# Patient Record
Sex: Male | Born: 2000 | Hispanic: No | Marital: Single
Health system: Southern US, Community
[De-identification: ages and names within clinical notes are randomized; demographics above are authoritative.]

---

## 2022-04-19 ENCOUNTER — Other Ambulatory Visit: Payer: Self-pay

## 2022-04-19 ENCOUNTER — Emergency Department (HOSPITAL_COMMUNITY)
Admission: EM | Admit: 2022-04-19 | Discharge: 2022-04-19 | Disposition: A | Discharge status: Home / Self Care | Payer: 59 | Attending: Emergency Medicine | Admitting: Emergency Medicine

## 2022-04-19 ENCOUNTER — Encounter (HOSPITAL_COMMUNITY): Payer: Self-pay | Admitting: Emergency Medicine

## 2022-04-19 ENCOUNTER — Emergency Department (HOSPITAL_COMMUNITY): Payer: 59

## 2022-04-19 DIAGNOSIS — D72829 Elevated white blood cell count, unspecified: Secondary | ICD-10-CM | POA: Diagnosis not present

## 2022-04-19 DIAGNOSIS — R197 Diarrhea, unspecified: Secondary | ICD-10-CM | POA: Insufficient documentation

## 2022-04-19 DIAGNOSIS — R1084 Generalized abdominal pain: Secondary | ICD-10-CM | POA: Insufficient documentation

## 2022-04-19 DIAGNOSIS — R112 Nausea with vomiting, unspecified: Secondary | ICD-10-CM | POA: Diagnosis present

## 2022-04-19 LAB — CBC
HCT: 50.2 % (ref 39.0–52.0)
Hemoglobin: 18.6 g/dL — ABNORMAL HIGH (ref 13.0–17.0)
MCH: 31.1 pg (ref 26.0–34.0)
MCHC: 37.1 g/dL — ABNORMAL HIGH (ref 30.0–36.0)
MCV: 83.8 fL (ref 80.0–100.0)
Platelets: 278 10*3/uL (ref 150–400)
RBC: 5.99 MIL/uL — ABNORMAL HIGH (ref 4.22–5.81)
RDW: 12.3 % (ref 11.5–15.5)
WBC: 18.7 10*3/uL — ABNORMAL HIGH (ref 4.0–10.5)
nRBC: 0 % (ref 0.0–0.2)

## 2022-04-19 LAB — COMPREHENSIVE METABOLIC PANEL
ALT: 13 U/L (ref 0–44)
AST: 20 U/L (ref 15–41)
Albumin: 4.7 g/dL (ref 3.5–5.0)
Alkaline Phosphatase: 113 U/L (ref 38–126)
Anion gap: 14 (ref 5–15)
BUN: 15 mg/dL (ref 6–20)
CO2: 21 mmol/L — ABNORMAL LOW (ref 22–32)
Calcium: 9.6 mg/dL (ref 8.9–10.3)
Chloride: 103 mmol/L (ref 98–111)
Creatinine, Ser: 1 mg/dL (ref 0.61–1.24)
GFR, Estimated: >60 mL/min (ref >60)
Glucose, Bld: 109 mg/dL — ABNORMAL HIGH (ref 70–99)
Potassium: 4.1 mmol/L (ref 3.5–5.1)
Sodium: 138 mmol/L (ref 135–145)
Total Bilirubin: 1.6 mg/dL — ABNORMAL HIGH (ref 0.3–1.2)
Total Protein: 7.3 g/dL (ref 6.5–8.1)

## 2022-04-19 LAB — CBC WITH DIFFERENTIAL/PLATELET
Abs Immature Granulocytes: 0.06 10*3/uL (ref 0.00–0.07)
Basophils Absolute: 0.1 10*3/uL (ref 0.0–0.1)
Basophils Relative: 0 %
Eosinophils Absolute: 0.1 10*3/uL (ref 0.0–0.5)
Eosinophils Relative: 0 %
HCT: 49.8 % (ref 39.0–52.0)
Hemoglobin: 17.7 g/dL — ABNORMAL HIGH (ref 13.0–17.0)
Immature Granulocytes: 0 %
Lymphocytes Relative: 7 %
Lymphs Abs: 1.1 10*3/uL (ref 0.7–4.0)
MCH: 30.4 pg (ref 26.0–34.0)
MCHC: 35.5 g/dL (ref 30.0–36.0)
MCV: 85.6 fL (ref 80.0–100.0)
Monocytes Absolute: 1.1 10*3/uL — ABNORMAL HIGH (ref 0.1–1.0)
Monocytes Relative: 7 %
Neutro Abs: 13.5 10*3/uL — ABNORMAL HIGH (ref 1.7–7.7)
Neutrophils Relative %: 86 %
Platelets: 246 10*3/uL (ref 150–400)
RBC: 5.82 MIL/uL — ABNORMAL HIGH (ref 4.22–5.81)
RDW: 12.2 % (ref 11.5–15.5)
WBC: 15.9 10*3/uL — ABNORMAL HIGH (ref 4.0–10.5)
nRBC: 0 % (ref 0.0–0.2)

## 2022-04-19 LAB — URINALYSIS, ROUTINE W REFLEX MICROSCOPIC
Bacteria, UA: NONE SEEN
Bilirubin Urine: NEGATIVE
Glucose, UA: NEGATIVE mg/dL
Hgb urine dipstick: NEGATIVE
Ketones, ur: 80 mg/dL — AB
Leukocytes,Ua: NEGATIVE
Nitrite: NEGATIVE
Protein, ur: 30 mg/dL — AB
Specific Gravity, Urine: 1.032 — ABNORMAL HIGH (ref 1.005–1.030)
pH: 5 (ref 5.0–8.0)

## 2022-04-19 LAB — LIPASE, BLOOD: Lipase: 34 U/L (ref 11–51)

## 2022-04-19 MED ORDER — SODIUM CHLORIDE 0.9 % IV BOLUS
2000.0000 mL | Freq: Once | INTRAVENOUS | Status: AC
  Administered 2022-04-19: 2000 mL via INTRAVENOUS

## 2022-04-19 MED ORDER — ONDANSETRON HCL 4 MG/2ML IJ SOLN
4.0000 mg | Freq: Once | INTRAMUSCULAR | Status: AC
  Administered 2022-04-19: 4 mg via INTRAVENOUS
  Filled 2022-04-19: qty 2

## 2022-04-19 MED ORDER — IOHEXOL 300 MG/ML  SOLN
100.0000 mL | Freq: Once | INTRAMUSCULAR | Status: AC | PRN
  Administered 2022-04-19: 100 mL via INTRAVENOUS

## 2022-04-19 MED ORDER — ONDANSETRON 4 MG PO TBDP: 4.0000 mg | ORAL_TABLET | Freq: Three times a day (TID) | ORAL | 0 refills | Status: AC | PRN

## 2022-04-19 MED ORDER — DICYCLOMINE HCL 20 MG PO TABS: 20.0000 mg | ORAL_TABLET | Freq: Two times a day (BID) | ORAL | 0 refills | Status: AC

## 2022-04-19 NOTE — Discharge Instructions (Addendum)
Your work-up today was reassuring.  Your CT scan did not show any evidence of acute infection, inflammation concerning for serious cause of your symptoms.  Given your food ingestion followed by your symptoms you most likely have gastroenteritis.  Typically this peaks at about 3 days.  I have sent Zofran into the pharmacy for you.  Please ensure that you are staying hydrated and drinking plenty of fluids.  If you have any worsening or concerning symptoms such as worsening abdominal pain, fever, inability to tolerate any p.o. intake despite taking Zofran please return to the emergency room.  Otherwise I recommend you follow-up with your PCP. ?I have also sent Bentyl into the pharmacy for you which you can take as needed for abdominal pain. ?

## 2022-04-19 NOTE — ED Triage Notes (Signed)
Pt reported to ED with c/o pain to epigastric area of abdomen and vomiting since approximately 8 pm last night. Pt states he has also had diarrhea. Denies any fevers, shortness of breath or cough or chest pain.  ?

## 2022-04-19 NOTE — ED Provider Notes (Signed)
?[facility] ?Provider Note ? ? ?CSN: [id number] ?Arrival date & time: 04/19/22  [id number] ? ?  ? ?History ? ?Chief Complaint  ?Patient presents with  ? Abdominal Pain  ? ? ?Thomas Bolton is a 21 y.o. male. ? ?21 year old male presents with his roommate for evaluation of nausea, vomiting, abdominal pain onset last night around 8 PM following dinner.  Patient states he had tikka masala.  States he was feeling well without any complaints prior to event and immediately after his supper he had all of the presenting symptoms.  States he had about 14 episodes of emesis and 2 episodes of diarrhea since then.  Does report mild improvement in abdominal pain.  Denies fever, chills.  Emesis nonbloody and nonbilious.  No blood in the stool. ? ?The history is provided by the patient. No language interpreter was used.  ? ?  ? ?Home Medications ?Prior to Admission medications   ?Not on File  ?   ? ?Allergies    ?Patient has no known allergies.   ? ?Review of Systems   ?Review of Systems  ?Constitutional:  Negative for chills and fever.  ?Gastrointestinal:  Positive for abdominal pain, diarrhea, nausea and vomiting. Negative for blood in stool and constipation.  ?Neurological:  Negative for syncope and light-headedness.  ?All other systems reviewed and are negative. ? ?Physical Exam ?Updated Vital Signs ?BP 122/90   Pulse 64   Temp 98.4 ?F (36.9 ?C) (Oral)   Resp 15   SpO2 98%  ?Physical Exam ?Vitals and nursing note reviewed.  ?Constitutional:   ?   General: He is not in acute distress. ?   Appearance: Normal appearance. He is not ill-appearing.  ?HENT:  ?   Head: Normocephalic and atraumatic.  ?   Nose: Nose normal.  ?Eyes:  ?   General: No scleral icterus. ?   Extraocular Movements: Extraocular movements intact.  ?   Conjunctiva/sclera: Conjunctivae normal.  ?Cardiovascular:  ?   Rate and Rhythm: Normal rate and regular rhythm.  ?   Pulses: Normal pulses.  ?   Heart sounds: Normal heart sounds.   ?Pulmonary:  ?   Effort: Pulmonary effort is normal. No respiratory distress.  ?   Breath sounds: Normal breath sounds. No wheezing or rales.  ?Abdominal:  ?   General: There is no distension.  ?   Tenderness: There is abdominal tenderness (Mild tenderness diffusely.). There is no right CVA tenderness, left CVA tenderness, guarding or rebound.  ?Musculoskeletal:     ?   General: Normal range of motion.  ?   Cervical back: Normal range of motion.  ?Skin: ?   General: Skin is warm and dry.  ?Neurological:  ?   General: No focal deficit present.  ?   Mental Status: He is alert. Mental status is at baseline.  ? ? ?ED Results / Procedures / Treatments   ?Labs ?(all labs ordered are listed, but only abnormal results are displayed) ?Labs Reviewed  ?COMPREHENSIVE METABOLIC PANEL - Abnormal; Notable for the following components:  ?    Result Value  ? CO2 21 (*)   ? Glucose, Bld 109 (*)   ? Total Bilirubin 1.6 (*)   ? All other components within normal limits  ?CBC - Abnormal; Notable for the following components:  ? WBC 18.7 (*)   ? RBC 5.99 (*)   ? Hemoglobin 18.6 (*)   ? MCHC 37.1 (*)   ? All other components within normal limits  ?  URINALYSIS, ROUTINE W REFLEX MICROSCOPIC - Abnormal; Notable for the following components:  ? Color, Urine AMBER (*)   ? Specific Gravity, Urine 1.032 (*)   ? Ketones, ur 80 (*)   ? Protein, ur 30 (*)   ? All other components within normal limits  ?LIPASE, BLOOD  ?CBC WITH DIFFERENTIAL/PLATELET  ? ? ?EKG ?None ? ?Radiology ?No results found. ? ?Procedures ?Procedures  ? ? ?Medications Ordered in ED ?Medications  ?sodium chloride 0.9 % bolus 2,000 mL (has no administration in time range)  ?ondansetron (ZOFRAN) injection 4 mg (has no administration in time range)  ? ? ?ED Course/ Medical Decision Making/ A&P ?  ?                        ?Medical Decision Making ?Amount and/or Complexity of Data Reviewed ?Labs: ordered. ?Radiology: ordered. ? ?Risk ?Prescription drug management. ? ? ?Medical  Decision Making / ED Course ? ? ?This patient presents to the ED for concern of nausea, vomiting, abdominal pain, this involves an extensive number of treatment options, and is a complaint that carries with it a high risk of complications and morbidity.  The differential diagnosis includes appendicitis, gastroenteritis, cholecystitis, pancreatitis, colitis ? ?MDM: ?21 year old male presents with complaints of nausea, vomiting, diarrhea and mild abdominal pain since 8 PM last night following eating dinner.  Denies alcohol intake.  He had [place] food.  States he was feeling well before dinner.  Denies other complaints.  Appears well on exam.  Initial blood work shows leukocytosis of 18.7.  CMP without acute concerns.  Lipase within normal limits.  UA without UTI.  Will provide Zofran, IV hydration.  Will evaluate with CT abdomen pelvis to rule out appendicitis, cholecystitis, other acute intra-abdominal process.  We will add on CBC with differential. ?Patient following fluids and Zofran reports significant improvement in symptoms.  Has not had any additional episode of emesis.  CT abdomen pelvis does not show evidence of acute intra-abdominal process.  It does show changes consistent with enteritis.  Patient most likely has gastroenteritis.  Discussed with patient and his roommate they both voiced understanding.  Will provide Zofran and Bentyl and discussed symptomatic treatment.  They voiced understanding and are in agreement with plan. ? ? ?Lab Tests: ?-I ordered, reviewed, and interpreted labs.   ?The pertinent results include:   ?Labs Reviewed  ?COMPREHENSIVE METABOLIC PANEL - Abnormal; Notable for the following components:  ?    Result Value  ? CO2 21 (*)   ? Glucose, Bld 109 (*)   ? Total Bilirubin 1.6 (*)   ? All other components within normal limits  ?CBC - Abnormal; Notable for the following components:  ? WBC 18.7 (*)   ? RBC 5.99 (*)   ? Hemoglobin 18.6 (*)   ? MCHC 37.1 (*)   ? All other components within  normal limits  ?URINALYSIS, ROUTINE W REFLEX MICROSCOPIC - Abnormal; Notable for the following components:  ? Color, Urine AMBER (*)   ? Specific Gravity, Urine 1.032 (*)   ? Ketones, ur 80 (*)   ? Protein, ur 30 (*)   ? All other components within normal limits  ?CBC WITH DIFFERENTIAL/PLATELET - Abnormal; Notable for the following components:  ? WBC 15.9 (*)   ? RBC 5.82 (*)   ? Hemoglobin 17.7 (*)   ? Neutro Abs 13.5 (*)   ? Monocytes Absolute 1.1 (*)   ? All other components within normal limits  ?  LIPASE, BLOOD  ?  ? ? ?EKG ? EKG Interpretation ? ?Date/Time:    ?Ventricular Rate:    ?PR Interval:    ?QRS Duration:   ?QT Interval:    ?QTC Calculation:   ?R Axis:     ?Text Interpretation:   ?  ? ?  ? ? ? ?Imaging Studies ordered: ?I ordered imaging studies including CT abdomen pelvis with contrast ?I independently visualized and interpreted imaging. ?I agree with the radiologist interpretation ? ? ?Medicines ordered and prescription drug management: ?Meds ordered this encounter  ?Medications  ? sodium chloride 0.9 % bolus 2,000 mL  ? ondansetron (ZOFRAN) injection 4 mg  ? iohexol (OMNIPAQUE) 300 MG/ML solution 100 mL  ?  ?-I have reviewed the patients home medicines and have made adjustments as needed ? ?Reevaluation: ?After the interventions noted above, I reevaluated the patient and found that they have :improved ?Patient is well-appearing. ?Co morbidities that complicate the patient evaluation ?History reviewed. No pertinent past medical history.  ? ? ?Dispostion: ?Patient is appropriate for discharge.  Discharged in stable condition.  Return precautions discussed.  Patient voices understanding and is in agreement with plan. ? ?Final Clinical Impression(s) / ED Diagnoses ?Final diagnoses:  ?Nausea vomiting and diarrhea  ? ? ?Rx / DC Orders ?ED Discharge Orders   ? ?      Ordered  ?  ondansetron (ZOFRAN-ODT) 4 MG disintegrating tablet  Every 8 hours PRN       ? 04/19/22 1155  ?  dicyclomine (BENTYL) 20 MG tablet   2 times daily       ? 04/19/22 1155  ? ?  ?  ? ?  ? ? ?  ?Marita [place], PA-C ?04/19/22 1155 ? ?  ?Sloan Leiter, DO ?04/20/22 1857 ? ?

## 2023-03-31 IMAGING — CT CT ABD-PELV W/ CM
2 of 7 series · 9 of 46 positions shown, 10 images · IV contrast (Omni 300)
Comparison: None.

CLINICAL DATA: Abdominal pain, acute, nonlocalized

EXAM:
CT ABDOMEN AND PELVIS WITH CONTRAST
TECHNIQUE: Multidetector CT imaging of the abdomen and pelvis was performed
using the standard protocol following bolus administration of
intravenous contrast.

[Series 6: a/p w/ cor · coronal · 0.21mm/px · 3 of 149 slices shown]
[im 38/149  soft-tissue]
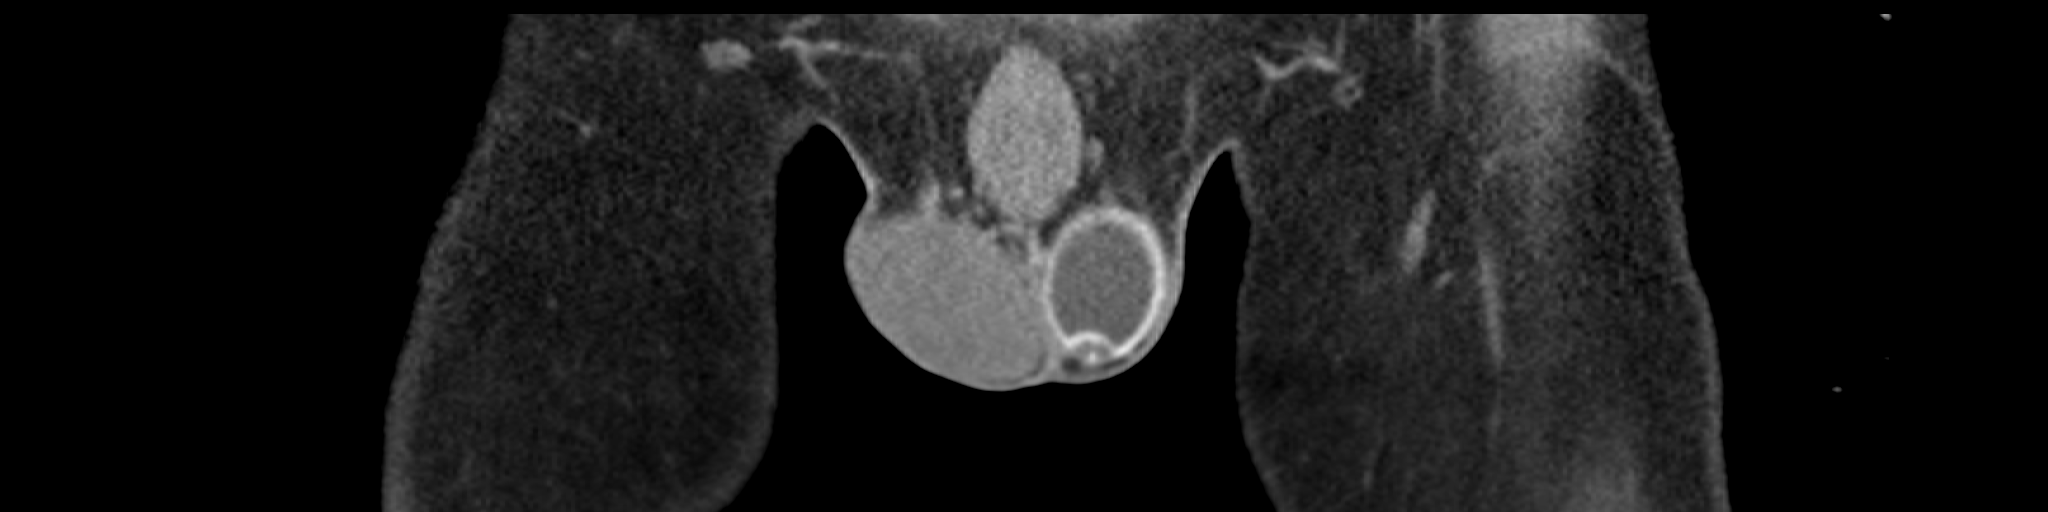
[im 75/149  soft-tissue]
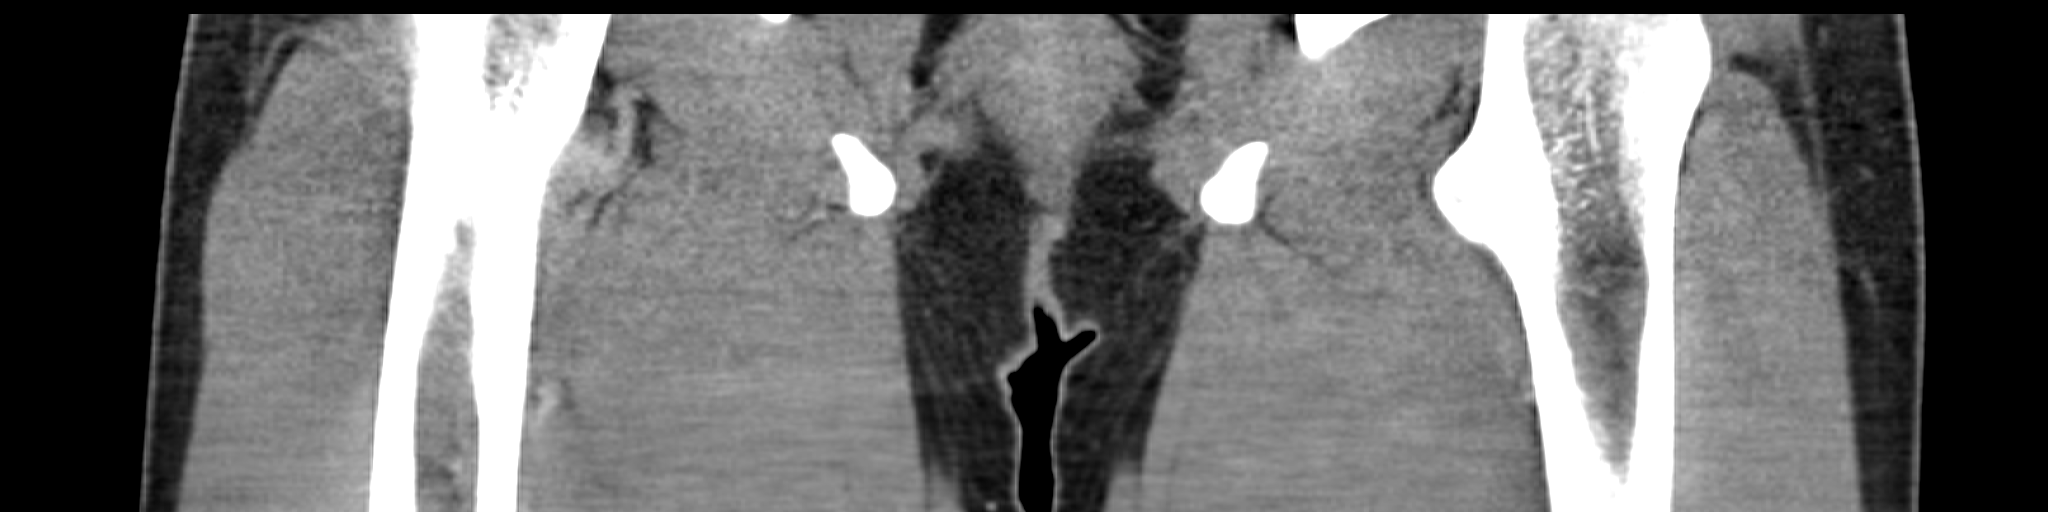
[im 112/149  soft-tissue]
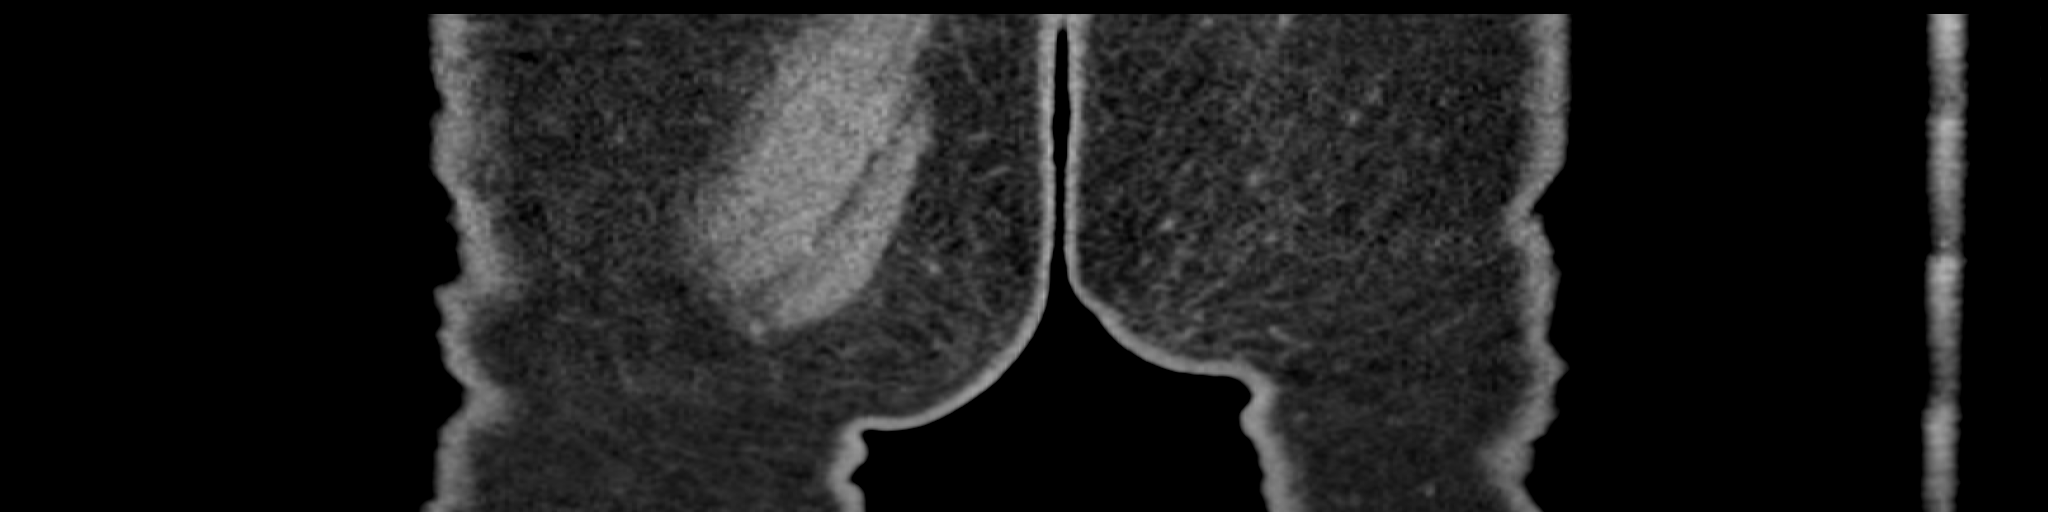

[Series 7: a/p w/ 5mm · axial · 0.71mm/px · z∈[+776,+1146]mm · 6 of 94 slices shown, 7 images]
[im 10/94  soft-tissue]
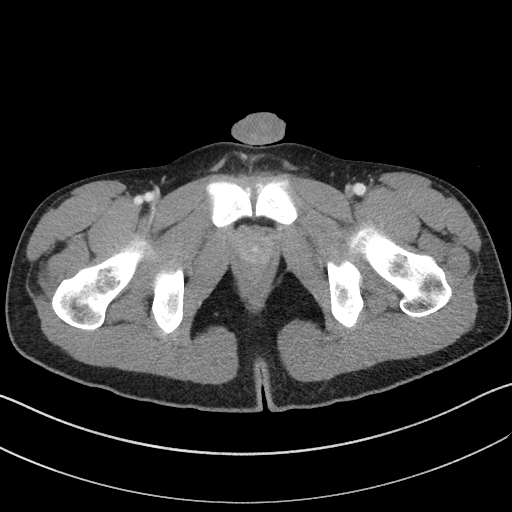
[im 10/94  bone]
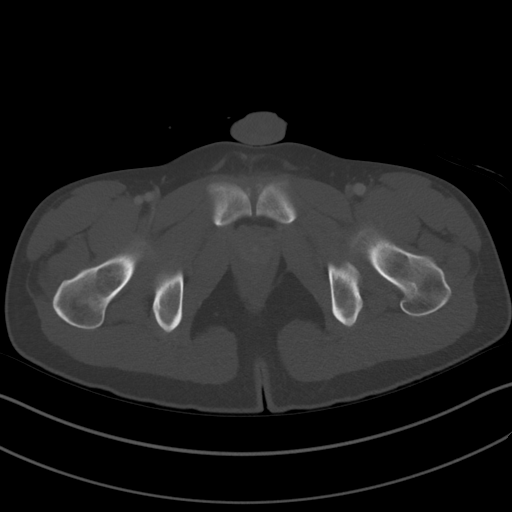
[im 24/94  soft-tissue]
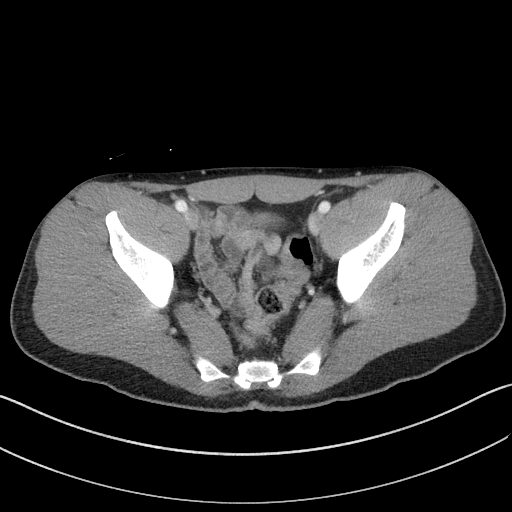
[im 38/94  soft-tissue]
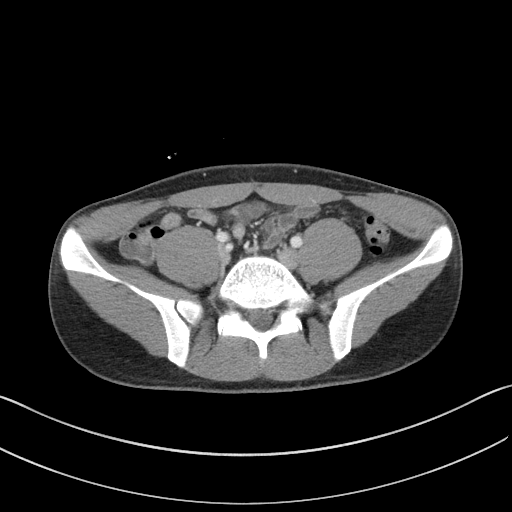
[im 56/94  soft-tissue]
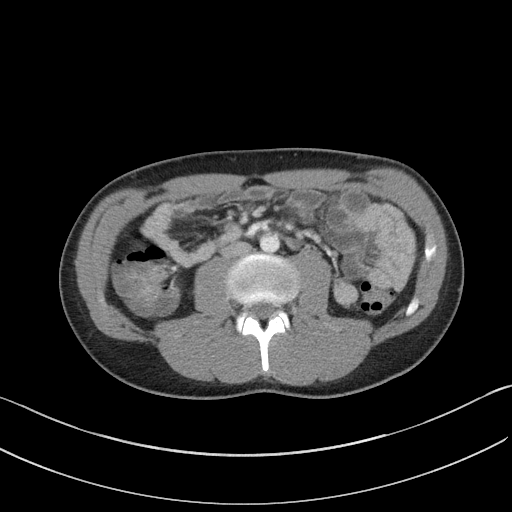
[im 70/94  soft-tissue]
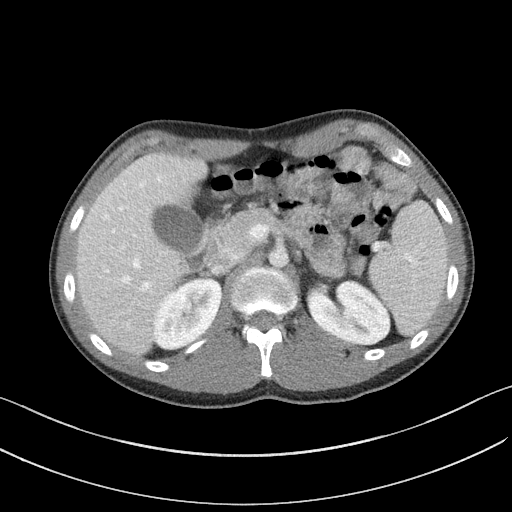
[im 84/94  soft-tissue]
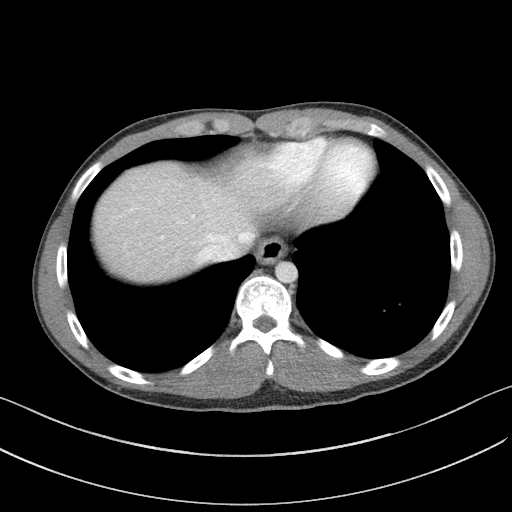

[9 of 46 positions shown; findings below may reference images not displayed]

RADIATION DOSE REDUCTION: This exam was performed according to the
departmental dose-optimization program which includes automated
exposure control, adjustment of the mA and/or kV according to
patient size and/or use of iterative reconstruction technique.

CONTRAST:  100mL OMNIPAQUE IOHEXOL 300 MG/ML  SOLN
FINDINGS: Lower chest: Included lung bases are clear.  Heart size is normal.

Hepatobiliary: Small area of presumed focal fatty infiltration along
the falciform ligament. Otherwise, no focal liver abnormality is
seen. No gallstones, gallbladder wall thickening, or biliary
dilatation.

Pancreas: Unremarkable. No pancreatic ductal dilatation or
surrounding inflammatory changes.

Spleen: Normal in size without focal abnormality.

Adrenals/Urinary Tract: Unremarkable adrenal glands. Kidneys enhance
symmetrically without focal lesion, stone, or hydronephrosis.
Ureters are nondilated. Mild circumferential wall thickening of the
urinary bladder.

Stomach/Bowel: Stomach is within normal limits. Appendix appears
normal (series 7, image 50). Numerous fluid-filled, nondilated loops
of small bowel throughout the abdomen. No evidence of bowel wall
thickening, abnormal distention, or inflammatory changes.

Vascular/Lymphatic: No significant vascular findings are present. No
enlarged abdominal or pelvic lymph nodes.

Reproductive: Prostate is unremarkable. Prosthesis within the left
scrotum.

Other: No free fluid. No abdominopelvic fluid collection. No
pneumoperitoneum. No abdominal wall hernia.

Musculoskeletal: No acute or significant osseous findings.
IMPRESSION: 1. Numerous fluid-filled, nondilated loops of small bowel throughout
the abdomen, suggestive of a nonspecific enteritis. Negative for
bowel obstruction.
2. Mild circumferential wall thickening of the urinary bladder,
which may represent cystitis. Correlate with urinalysis.
3. Normal appendix.
